# Patient Record
Sex: Male | Born: 1975 | Race: Black or African American | Hispanic: No | Marital: Single | Smoking: Current every day smoker
Health system: Southern US, Community
[De-identification: ages and names within clinical notes are randomized; demographics above are authoritative.]

---

## 2017-06-24 ENCOUNTER — Encounter: Payer: Self-pay | Admitting: Emergency Medicine

## 2017-06-24 ENCOUNTER — Emergency Department: Payer: No Typology Code available for payment source

## 2017-06-24 ENCOUNTER — Emergency Department
Admission: EM | Admit: 2017-06-24 | Discharge: 2017-06-24 | Disposition: A | Discharge status: Home / Self Care | Payer: No Typology Code available for payment source | Attending: Emergency Medicine | Admitting: Emergency Medicine

## 2017-06-24 ENCOUNTER — Other Ambulatory Visit: Payer: Self-pay

## 2017-06-24 DIAGNOSIS — S161XXA Strain of muscle, fascia and tendon at neck level, initial encounter: Secondary | ICD-10-CM | POA: Diagnosis not present

## 2017-06-24 DIAGNOSIS — S199XXA Unspecified injury of neck, initial encounter: Secondary | ICD-10-CM | POA: Diagnosis present

## 2017-06-24 DIAGNOSIS — M7918 Myalgia, other site: Secondary | ICD-10-CM | POA: Diagnosis not present

## 2017-06-24 DIAGNOSIS — F17200 Nicotine dependence, unspecified, uncomplicated: Secondary | ICD-10-CM | POA: Diagnosis not present

## 2017-06-24 DIAGNOSIS — Y9389 Activity, other specified: Secondary | ICD-10-CM | POA: Insufficient documentation

## 2017-06-24 DIAGNOSIS — Y998 Other external cause status: Secondary | ICD-10-CM | POA: Insufficient documentation

## 2017-06-24 DIAGNOSIS — Y92410 Unspecified street and highway as the place of occurrence of the external cause: Secondary | ICD-10-CM | POA: Insufficient documentation

## 2017-06-24 MED ORDER — TRAMADOL HCL 50 MG PO TABS
50.0000 mg | ORAL_TABLET | Freq: Once | ORAL | Status: AC
  Administered 2017-06-24: 50 mg via ORAL
  Filled 2017-06-24: qty 1

## 2017-06-24 MED ORDER — CYCLOBENZAPRINE HCL 10 MG PO TABS: 10.0000 mg | ORAL_TABLET | Freq: Three times a day (TID) | ORAL | 0 refills | Status: AC | PRN

## 2017-06-24 MED ORDER — CYCLOBENZAPRINE HCL 10 MG PO TABS
10.0000 mg | ORAL_TABLET | Freq: Once | ORAL | Status: AC
Start: 2017-06-24 — End: 2017-06-24
  Administered 2017-06-24: 10 mg via ORAL
  Filled 2017-06-24: qty 1

## 2017-06-24 MED ORDER — TRAMADOL HCL 50 MG PO TABS: 50.0000 mg | ORAL_TABLET | Freq: Two times a day (BID) | ORAL | 0 refills | Status: AC | PRN
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
